# Patient Record
Sex: Female | Born: 1965 | Hispanic: Yes | Marital: Married
Health system: Southern US, Community
[De-identification: ages and names within clinical notes are randomized; demographics above are authoritative.]

---

## 2016-10-12 ENCOUNTER — Encounter (HOSPITAL_COMMUNITY): Payer: Self-pay | Admitting: Emergency Medicine

## 2016-10-12 ENCOUNTER — Emergency Department (HOSPITAL_COMMUNITY)
Admission: EM | Admit: 2016-10-12 | Discharge: 2016-10-12 | Disposition: A | Discharge status: Home / Self Care | Payer: No Typology Code available for payment source | Attending: Physician Assistant | Admitting: Physician Assistant

## 2016-10-12 ENCOUNTER — Emergency Department (HOSPITAL_COMMUNITY): Payer: No Typology Code available for payment source

## 2016-10-12 DIAGNOSIS — Y999 Unspecified external cause status: Secondary | ICD-10-CM | POA: Insufficient documentation

## 2016-10-12 DIAGNOSIS — Y9389 Activity, other specified: Secondary | ICD-10-CM | POA: Diagnosis not present

## 2016-10-12 DIAGNOSIS — S20212A Contusion of left front wall of thorax, initial encounter: Secondary | ICD-10-CM | POA: Diagnosis not present

## 2016-10-12 DIAGNOSIS — S301XXA Contusion of abdominal wall, initial encounter: Secondary | ICD-10-CM

## 2016-10-12 DIAGNOSIS — S3092XA Unspecified superficial injury of abdominal wall, initial encounter: Secondary | ICD-10-CM | POA: Diagnosis present

## 2016-10-12 DIAGNOSIS — Y92410 Unspecified street and highway as the place of occurrence of the external cause: Secondary | ICD-10-CM | POA: Diagnosis not present

## 2016-10-12 LAB — COMPREHENSIVE METABOLIC PANEL
ALBUMIN: 4 g/dL (ref 3.5–5.0)
ALT: 89 U/L — AB (ref 14–54)
AST: 61 U/L — AB (ref 15–41)
Alkaline Phosphatase: 136 U/L — ABNORMAL HIGH (ref 38–126)
Anion gap: 6 (ref 5–15)
BUN: 8 mg/dL (ref 6–20)
CHLORIDE: 107 mmol/L (ref 101–111)
CO2: 24 mmol/L (ref 22–32)
CREATININE: 0.6 mg/dL (ref 0.44–1.00)
Calcium: 9.3 mg/dL (ref 8.9–10.3)
GFR calc Af Amer: >60 mL/min (ref >60)
GFR calc non Af Amer: >60 mL/min (ref >60)
GLUCOSE: 150 mg/dL — AB (ref 65–99)
Potassium: 3.9 mmol/L (ref 3.5–5.1)
Sodium: 137 mmol/L (ref 135–145)
Total Bilirubin: 0.8 mg/dL (ref 0.3–1.2)
Total Protein: 7.3 g/dL (ref 6.5–8.1)

## 2016-10-12 LAB — CBC WITH DIFFERENTIAL/PLATELET
BASOS ABS: 0 10*3/uL (ref 0.0–0.1)
BASOS PCT: 0 %
EOS PCT: 1 %
Eosinophils Absolute: 0.1 10*3/uL (ref 0.0–0.7)
HEMATOCRIT: 39.1 % (ref 36.0–46.0)
Hemoglobin: 13.2 g/dL (ref 12.0–15.0)
LYMPHS PCT: 27 %
Lymphs Abs: 2.9 10*3/uL (ref 0.7–4.0)
MCH: 30.8 pg (ref 26.0–34.0)
MCHC: 33.8 g/dL (ref 30.0–36.0)
MCV: 91.4 fL (ref 78.0–100.0)
MONO ABS: 0.7 10*3/uL (ref 0.1–1.0)
Monocytes Relative: 7 %
NEUTROS ABS: 7 10*3/uL (ref 1.7–7.7)
Neutrophils Relative %: 65 %
PLATELETS: 250 10*3/uL (ref 150–400)
RBC: 4.28 MIL/uL (ref 3.87–5.11)
RDW: 13.5 % (ref 11.5–15.5)
WBC: 10.7 10*3/uL — AB (ref 4.0–10.5)

## 2016-10-12 MED ORDER — METHOCARBAMOL 500 MG PO TABS: 500.0000 mg | ORAL_TABLET | Freq: Two times a day (BID) | ORAL | 0 refills | Status: AC

## 2016-10-12 MED ORDER — IOPAMIDOL (ISOVUE-300) INJECTION 61%
INTRAVENOUS | Status: AC
  Administered 2016-10-12: 100 mL
  Filled 2016-10-12: qty 100

## 2016-10-12 MED ORDER — IBUPROFEN 800 MG PO TABS: 800.0000 mg | ORAL_TABLET | Freq: Three times a day (TID) | ORAL | 0 refills | Status: AC

## 2016-10-12 NOTE — Discharge Instructions (Signed)
Return if any problems.  See your primary Md for recheck in 1 week.  The radiologist has advised that you need to have a repeat ct scan in 3 months to rule out mass of your appendix.
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
## 2016-10-12 NOTE — ED Notes (Signed)
D.c instructions reviewed with ipad interpreter, no further questions
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
